# Patient Record
Sex: Male | Born: 1995 | Race: White | Hispanic: No | Marital: Single | Smoking: Never smoker
Health system: Southern US, Community
[De-identification: ages and names within clinical notes are randomized; demographics above are authoritative.]

## PROBLEM LIST (undated history)

## (undated) DIAGNOSIS — F988 Other specified behavioral and emotional disorders with onset usually occurring in childhood and adolescence: Secondary | ICD-10-CM

## (undated) HISTORY — PX: WISDOM TOOTH EXTRACTION: SHX21

## (undated) HISTORY — PX: ARTHROSCOPIC REPAIR ACL: SUR80

## (undated) HISTORY — DX: Other specified behavioral and emotional disorders with onset usually occurring in childhood and adolescence: F98.8

---
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
## 2014-01-30 ENCOUNTER — Telehealth: Payer: Self-pay | Admitting: Nurse Practitioner

## 2014-01-30 NOTE — Telephone Encounter (Signed)
Patient aware.

## 2014-01-30 NOTE — Telephone Encounter (Signed)
Has not been seen for ADD in almost a year- no refill without appointment

## 2014-02-01 NOTE — Telephone Encounter (Signed)
Patient said that he seen bill oxford not mmm bill please review for refill on concerta

## 2014-02-01 NOTE — Telephone Encounter (Signed)
Called in.

## 2014-02-04 ENCOUNTER — Other Ambulatory Visit: Payer: Self-pay

## 2014-02-04 ENCOUNTER — Telehealth: Payer: Self-pay | Admitting: Family Medicine

## 2014-02-04 NOTE — Telephone Encounter (Signed)
Last seen 12/20/13  B Oxford If approved print and route to nurse

## 2014-02-05 ENCOUNTER — Other Ambulatory Visit: Payer: Self-pay | Admitting: Family Medicine

## 2014-02-05 MED ORDER — METHYLPHENIDATE HCL ER (OSM) 36 MG PO TBCR: 36.0000 mg | EXTENDED_RELEASE_TABLET | ORAL | Status: DC

## 2014-02-05 NOTE — Telephone Encounter (Signed)
Prescription signed.

## 2014-04-30 ENCOUNTER — Telehealth: Payer: Self-pay | Admitting: Family Medicine

## 2014-04-30 NOTE — Telephone Encounter (Signed)
Appt scheduled. Patient aware. 

## 2014-05-10 ENCOUNTER — Encounter: Payer: Self-pay | Admitting: Family Medicine

## 2014-05-10 ENCOUNTER — Ambulatory Visit (INDEPENDENT_AMBULATORY_CARE_PROVIDER_SITE_OTHER): Payer: BC Managed Care – PPO | Admitting: Family Medicine

## 2014-05-10 VITALS — BP 107/65 | HR 62 | Temp 98.5°F | Ht 68.5 in | Wt 171.4 lb

## 2014-05-10 DIAGNOSIS — F909 Attention-deficit hyperactivity disorder, unspecified type: Secondary | ICD-10-CM

## 2014-05-10 MED ORDER — METHYLPHENIDATE HCL ER (OSM) 36 MG PO TBCR: 36.0000 mg | EXTENDED_RELEASE_TABLET | ORAL | Status: DC

## 2014-05-10 NOTE — Progress Notes (Signed)
   Subjective:    Patient ID: Casey Bowman, male    DOB: 1996-01-28, 18 y.o.   MRN: [id number]  HPI This 18 y.o. male presents for evaluation of ADHD.  He is doing fine with his concerta and is tolerating it well.  He denies any weight loss.   Review of Systems No chest pain, SOB, HA, dizziness, vision change, N/V, diarrhea, constipation, dysuria, urinary urgency or frequency, myalgias, arthralgias or rash.     Objective:   Physical Exam  Vital signs noted  Well developed well nourished male.  HEENT - Head atraumatic Normocephalic                Eyes - PERRLA, Conjuctiva - clear Sclera- Clear EOMI                Ears - EAC's Wnl TM's Wnl Gross Hearing WNL                Nose - Nares patent                 Throat - oropharanx wnl Respiratory - Lungs CTA bilateral Cardiac - RRR S1 and S2 without murmur GI - Abdomen soft Nontender and bowel sounds active x 4 Extremities - No edema. Neuro - Grossly intact.      Assessment & Plan:  Attention deficit hyperactivity disorder (ADHD), unspecified ADHD type Continue methylphenidate 36mg  po qd Follow up in 3 months  Deatra CanterWilliam J Murielle Stang FNP

## 2014-07-11 ENCOUNTER — Other Ambulatory Visit: Payer: Self-pay | Admitting: Family Medicine

## 2014-07-12 MED ORDER — METHYLPHENIDATE HCL ER (OSM) 36 MG PO TBCR: 36.0000 mg | EXTENDED_RELEASE_TABLET | ORAL | Status: DC

## 2014-08-23 ENCOUNTER — Telehealth: Payer: Self-pay | Admitting: Family Medicine

## 2014-08-23 ENCOUNTER — Other Ambulatory Visit: Payer: Self-pay | Admitting: Family Medicine

## 2014-08-23 MED ORDER — METHYLPHENIDATE HCL ER (OSM) 36 MG PO TBCR: 36.0000 mg | EXTENDED_RELEASE_TABLET | ORAL | Status: DC

## 2014-08-23 NOTE — Telephone Encounter (Signed)
Left message that pt needs appt.

## 2014-08-23 NOTE — Telephone Encounter (Signed)
Please review and advise.

## 2014-09-25 ENCOUNTER — Ambulatory Visit (INDEPENDENT_AMBULATORY_CARE_PROVIDER_SITE_OTHER): Payer: BC Managed Care – PPO | Admitting: Family Medicine

## 2014-09-25 ENCOUNTER — Encounter: Payer: Self-pay | Admitting: Family Medicine

## 2014-09-25 VITALS — BP 116/75 | HR 75 | Temp 98.4°F | Ht 68.5 in | Wt 176.0 lb

## 2014-09-25 DIAGNOSIS — F900 Attention-deficit hyperactivity disorder, predominantly inattentive type: Secondary | ICD-10-CM

## 2014-09-25 MED ORDER — METHYLPHENIDATE HCL ER (OSM) 36 MG PO TBCR: 36.0000 mg | EXTENDED_RELEASE_TABLET | ORAL | Status: DC

## 2014-09-25 MED ORDER — METHYLPHENIDATE HCL ER (OSM) 36 MG PO TBCR: 36.0000 mg | EXTENDED_RELEASE_TABLET | ORAL | Status: AC

## 2014-09-25 NOTE — Progress Notes (Signed)
   Subjective:    Patient ID: Casey Bowman, male    DOB: August 15, 1996, 18 y.o.   MRN: [id number]  HPI Patient is here for follow up on ADHD.  He is taking methylphenidate 36mg  po qd  Review of Systems  Constitutional: Negative for fever.  HENT: Negative for ear pain.   Eyes: Negative for discharge.  Respiratory: Negative for cough.   Cardiovascular: Negative for chest pain.  Gastrointestinal: Negative for abdominal distention.  Endocrine: Negative for polyuria.  Genitourinary: Negative for difficulty urinating.  Musculoskeletal: Negative for gait problem and neck pain.  Skin: Negative for color change and rash.  Neurological: Negative for speech difficulty and headaches.  Psychiatric/Behavioral: Negative for agitation.       Objective:    BP 116/75 mmHg  Pulse 75  Temp(Src) 98.4 F (36.9 C) (Oral)  Ht 5' 8.5" (1.74 m)  Wt 176 lb (79.833 kg)  BMI 26.37 kg/m2 Physical Exam  Constitutional: He is oriented to person, place, and time. He appears well-developed and well-nourished.  HENT:  Head: Normocephalic and atraumatic.  Mouth/Throat: Oropharynx is clear and moist.  Eyes: Pupils are equal, round, and reactive to light.  Neck: Normal range of motion. Neck supple.  Cardiovascular: Normal rate and regular rhythm.   No murmur heard. Pulmonary/Chest: Effort normal and breath sounds normal.  Abdominal: Soft. Bowel sounds are normal. There is no tenderness.  Neurological: He is alert and oriented to person, place, and time.  Skin: Skin is warm and dry.  Psychiatric: He has a normal mood and affect.          Assessment & Plan:     ICD-9-CM ICD-10-CM   1. 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  Deatra CanterWilliam J Oxford FNP

## 2015-01-16 ENCOUNTER — Other Ambulatory Visit: Payer: Self-pay | Admitting: Family Medicine

## 2015-01-16 DIAGNOSIS — J302 Other seasonal allergic rhinitis: Secondary | ICD-10-CM

## 2015-01-16 MED ORDER — LEVOCETIRIZINE DIHYDROCHLORIDE 5 MG PO TABS: 5.0000 mg | ORAL_TABLET | Freq: Every day | ORAL | Status: AC

## 2015-01-16 NOTE — Telephone Encounter (Signed)
RX sent into pharmacy. Pt notified. 

## 2015-01-17 NOTE — Telephone Encounter (Signed)
Pt's # [phone] is not accepting calls at this time to inform him that refill was sent to pharmacy

## 2021-08-31 ENCOUNTER — Encounter: Payer: Self-pay | Admitting: Emergency Medicine

## 2021-08-31 ENCOUNTER — Ambulatory Visit
Admission: EM | Admit: 2021-08-31 | Discharge: 2021-08-31 | Disposition: A | Discharge status: Home / Self Care | Payer: 59 | Attending: Family Medicine | Admitting: Family Medicine

## 2021-08-31 DIAGNOSIS — R109 Unspecified abdominal pain: Secondary | ICD-10-CM

## 2021-08-31 LAB — POCT URINALYSIS DIP (MANUAL ENTRY)
Bilirubin, UA: NEGATIVE
Glucose, UA: NEGATIVE mg/dL
Ketones, POC UA: NEGATIVE mg/dL
Nitrite, UA: NEGATIVE
Protein Ur, POC: NEGATIVE mg/dL
Spec Grav, UA: 1.015 (ref 1.010–1.025)
Urobilinogen, UA: 0.2 E.U./dL
pH, UA: 6 (ref 5.0–8.0)

## 2021-08-31 MED ORDER — KETOROLAC TROMETHAMINE 10 MG PO TABS: 10.0000 mg | ORAL_TABLET | Freq: Two times a day (BID) | ORAL | 0 refills | Status: AC | PRN

## 2021-08-31 NOTE — ED Triage Notes (Signed)
Pt here with bilateral back and lower abdominal pain x 2 weeks. States he had an upset stomach after eating fast food twice last week, but this is unrelated. States his father is prone to kidney stones so he took AZO yesterday and it made his sx better.

## 2021-08-31 NOTE — Discharge Instructions (Signed)
Take Toradol 10 mg twice daily as needed for flank pain.  I have a low suspicion for kidney stone suspect symptoms are more likely related to muscular type pain.  However if you develop any excruciating stabbing pain, fever go immediately to the emergency department as the symptoms are more consistent with that of a kidney stone.  For now recommend hydrating well with fluids and taking Toradol to decrease inflammation which is likely causing the pain.

## 2021-08-31 NOTE — ED Provider Notes (Signed)
Casey Bowman    CSN: [id number] Arrival date & time: 08/31/21  3762      History   Chief Complaint Chief Complaint  Patient presents with   Abdominal Pain   Back Pain    HPI Casey Bowman is a 25 y.o. male.   HPI Patient presents today with bilateral lower flank pain non radiating.  Pain present for 2 weeks.  He has taken AZO which reports improved symptoms of pain however pain persists with sitting or driving. He is concerned that he may have kidney stones.  No history of prior kidney stones but reports his father had kidney stones.  He endorses work that requires him to lift and stretch and uncertain also if he may have pulled a muscle.  He is also had some GI issues over the last few weeks related to diet habits and uncertain if this may be related.  Patient is afebrile. Denies dysuria, sharp pain, nausea, or vomiting. Past Medical History:  Diagnosis Date   Attention deficit disorder (ADD)     Patient Active Problem List   Diagnosis Date Noted   ADD (attention deficit disorder) 04/24/2013    Past Surgical History:  Procedure Laterality Date   WISDOM TOOTH EXTRACTION         Home Medications    Prior to Admission medications   Medication Sig Start Date End Date Taking? Authorizing Provider  ketorolac (TORADOL) 10 MG tablet Take 1 tablet (10 mg total) by mouth 2 (two) times daily between meals as needed. 08/31/21  Yes Bing Neighbors, FNP  levocetirizine (XYZAL) 5 MG tablet Take 1 tablet (5 mg total) by mouth daily. 01/16/15   Bennie Pierini, FNP  methylphenidate 36 MG PO CR tablet Take 1 tablet (36 mg total) by mouth every morning. 09/25/14   Deatra Canter, FNP    Family History History reviewed. No pertinent family history.  Social History Social History   Tobacco Use   Smoking status: Never   Smokeless tobacco: Never  Substance Use Topics   Alcohol use: No   Drug use: No     Allergies   Patient has no known  allergies.   Review of Systems Review of Systems Pertinent negatives listed in HPI   Physical Exam Triage Vital Signs ED Triage Vitals  Enc Vitals Group     BP 08/31/21 1030 (!) 148/83     Pulse Rate 08/31/21 1030 67     Resp 08/31/21 1030 18     Temp 08/31/21 1030 98.3 F (36.8 C)     Temp Source 08/31/21 1030 Oral     SpO2 08/31/21 1030 98 %     Weight --      Height --      Head Circumference --      Peak Flow --      Pain Score 08/31/21 1039 6     Pain Loc --      Pain Edu? --      Excl. in GC? --    No data found.  Updated Vital Signs BP (!) 148/83 (BP Location: Left Arm)   Pulse 67   Temp 98.3 F (36.8 C) (Oral)   Resp 18   SpO2 98%   Visual Acuity Right Eye Distance:   Left Eye Distance:   Bilateral Distance:    Right Eye Near:   Left Eye Near:    Bilateral Near:     Physical Exam General appearance: alert, well developed, well nourished, cooperative  and in no distress Head: Normocephalic, without obvious abnormality, atraumatic Respiratory: Respirations even and unlabored, normal respiratory rate Heart: Rate and rhythm normal.  Abdomen: BS +, no distention, no rebound tenderness, no reproducible flank pain  Thoracic-lumbar spine: No deformity or reproducible tenderness.   Skin: Skin color, texture, turgor normal. No rashes seen  Psych: Appropriate mood and affect. Neurologic: No focal neurological abnormity present on exam  UC Treatments / Results  Labs (all labs ordered are listed, but only abnormal results are displayed) Labs Reviewed  POCT URINALYSIS DIP (MANUAL ENTRY) - Abnormal; Notable for the following components:      Result Value   Clarity, UA cloudy (*)    Blood, UA trace-intact (*)    Leukocytes, UA Small (1+) (*)    All other components within normal limits    EKG   Radiology No results found.  Procedures Procedures (including critical care time)  Medications Ordered in UC Medications - No data to display  Initial  Impression / Assessment and Plan / UC Course  I have reviewed the triage vital signs and the nursing notes.  Pertinent labs & imaging results that were available during my care of the patient were reviewed by me and considered in my medical decision making (see chart for details).    Bilateral flank pain, renal stone versus muscle strain.  Patient has no reproducible tenderness on exam therefore low suspicion for that of a kidney stone.  Most likely pain is related to a muscle strain injury given that pain is most pronounced with changing positions especially from that of a sitting position.  We will trial a course of Toradol for 7 days.  Patient educated about red flag symptoms in which she should seek emergent evaluation.  Follow-up as needed.  Patient verbalized understanding agreement with plan. Final Clinical Impressions(s) / UC Diagnoses   Final diagnoses:  Pain, flank, bilateral     Discharge Instructions      Take Toradol 10 mg twice daily as needed for flank pain.  I have a low suspicion for kidney stone suspect symptoms are more likely related to muscular type pain.  However if you develop any excruciating stabbing pain, fever go immediately to the emergency department as the symptoms are more consistent with that of a kidney stone.  For now recommend hydrating well with fluids and taking Toradol to decrease inflammation which is likely causing the pain.     ED Prescriptions     Medication Sig Dispense Auth. Provider   ketorolac (TORADOL) 10 MG tablet Take 1 tablet (10 mg total) by mouth 2 (two) times daily between meals as needed. 14 tablet Bing Neighbors, FNP      PDMP not reviewed this encounter.   Bing Neighbors, FNP 08/31/21 [phone]

## 2023-04-15 ENCOUNTER — Emergency Department (HOSPITAL_BASED_OUTPATIENT_CLINIC_OR_DEPARTMENT_OTHER): Payer: 59

## 2023-04-15 ENCOUNTER — Other Ambulatory Visit: Payer: Self-pay

## 2023-04-15 ENCOUNTER — Emergency Department (HOSPITAL_BASED_OUTPATIENT_CLINIC_OR_DEPARTMENT_OTHER): Payer: 59 | Admitting: Radiology

## 2023-04-15 ENCOUNTER — Emergency Department (HOSPITAL_BASED_OUTPATIENT_CLINIC_OR_DEPARTMENT_OTHER)
Admission: EM | Admit: 2023-04-15 | Discharge: 2023-04-16 | Disposition: A | Discharge status: Home / Self Care | Payer: 59 | Attending: Emergency Medicine | Admitting: Emergency Medicine

## 2023-04-15 ENCOUNTER — Encounter (HOSPITAL_BASED_OUTPATIENT_CLINIC_OR_DEPARTMENT_OTHER): Payer: Self-pay

## 2023-04-15 DIAGNOSIS — S50312A Abrasion of left elbow, initial encounter: Secondary | ICD-10-CM | POA: Insufficient documentation

## 2023-04-15 DIAGNOSIS — S80211A Abrasion, right knee, initial encounter: Secondary | ICD-10-CM | POA: Insufficient documentation

## 2023-04-15 DIAGNOSIS — T07XXXA Unspecified multiple injuries, initial encounter: Secondary | ICD-10-CM

## 2023-04-15 DIAGNOSIS — S80212A Abrasion, left knee, initial encounter: Secondary | ICD-10-CM | POA: Insufficient documentation

## 2023-04-15 DIAGNOSIS — S8992XA Unspecified injury of left lower leg, initial encounter: Secondary | ICD-10-CM | POA: Diagnosis present

## 2023-04-15 DIAGNOSIS — Y92410 Unspecified street and highway as the place of occurrence of the external cause: Secondary | ICD-10-CM | POA: Diagnosis not present
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
## 2023-04-15 NOTE — ED Notes (Signed)
XR at the bedside.

## 2023-04-16 MED ORDER — BACITRACIN ZINC 500 UNIT/GM EX OINT
TOPICAL_OINTMENT | Freq: Two times a day (BID) | CUTANEOUS | Status: DC
  Administered 2023-04-16: 1 via TOPICAL

## 2023-04-16 MED ORDER — TETANUS-DIPHTH-ACELL PERTUSSIS 5-2.5-18.5 LF-MCG/0.5 IM SUSY: 0.5000 mL | PREFILLED_SYRINGE | Freq: Once | INTRAMUSCULAR | Status: DC
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
## 2023-04-16 NOTE — Discharge Instructions (Signed)
I suspect you have a knee ligamentous injury. There's no way to know that for sure without an MRI which I am not capable of doing. Please keep weight off your knee and use a brace for the next few days if you still have instability or pain I would follow up with your orthopedic doctor for reevaluation.

## 2023-04-16 NOTE — ED Notes (Addendum)
Pt left prior to receiving d/c instructions. Sts he will look at [facility] for home care and f/u. Plans to f/u with [facility] since he is already established there. Verbal instructions for wound care given. MD aware.
